# Patient Record
Sex: Female | Born: 1970 | Race: White | Hispanic: No | Marital: Married | Smoking: Current every day smoker
Health system: Southern US, Community
[De-identification: ages and names within clinical notes are randomized; demographics above are authoritative.]

## PROBLEM LIST (undated history)

## (undated) HISTORY — PX: APPENDECTOMY: SHX54

---

## 2013-09-15 ENCOUNTER — Other Ambulatory Visit: Payer: Self-pay | Admitting: Obstetrics and Gynecology

## 2013-10-01 NOTE — H&P (Signed)
Kathy Clarke, Kathy Clarke NO.:  [id number]  MEDICAL RECORD NO.:  [id number]  LOCATION:  [facility]                         FACILITY:  [facility]  PHYSICIAN:  Lovenia Kim, M.D.DATE OF BIRTH:  [birth year]  DATE OF ADMISSION:  10/02/2013 DATE OF DISCHARGE:                             HISTORY & PHYSICAL   CHIEF COMPLAINT:  Desire for sterilization.  HISTORY OF PRESENT ILLNESS:  A 43 year old female G3, P3, history of C- section x3 for tubal ligation.   ALLERGIES:  No known drug allergies.  MEDICATIONS:  No medications.  FAMILY HISTORY:  Heart disease, breast cancer.  SOCIAL HISTORY:  Noncontributory.  SURGICAL HISTORY:  C-section x3, appendectomy.  PHYSICAL EXAMINATION:  GENERAL:  A well-developed, well-nourished female, in no acute distress. HEENT:  Normal. NECK:  Supple.  Full range of motion. LUNGS:  Clear HEART:  Regular rate and rhythm. ABDOMEN:  Soft, nontender. PELVIC:  Normal size uterus and no adnexal masses. EXTREMITIES:  There were no cords. NEUROLOGIC:  Nonfocal. SKIN:  Intact.  IMPRESSION:  Desire for permanent sterilization.  PLAN:  Proceed with laparoscopic tubal ligation.  Risks of anesthesia, infection, bleeding, injury to surrounding organs, possible need for repair was discussed.  Delayed versus immediate complications include bowel and bladder injury noted.  The patient acknowledges and wishes to proceed.     Lovenia Kim, M.D.     RJT/MEDQ  D:  10/01/2013  T:  10/01/2013  Job:  [id number]

## 2013-10-02 ENCOUNTER — Encounter (HOSPITAL_COMMUNITY): Payer: BC Managed Care – PPO | Admitting: Anesthesiology

## 2013-10-02 ENCOUNTER — Encounter (HOSPITAL_COMMUNITY): Payer: Self-pay | Admitting: Pharmacist

## 2013-10-02 ENCOUNTER — Ambulatory Visit (HOSPITAL_COMMUNITY)
Admission: RE | Admit: 2013-10-02 | Discharge: 2013-10-02 | Disposition: A | Discharge status: Home / Self Care | Payer: BC Managed Care – PPO | Source: Ambulatory Visit | Attending: Obstetrics and Gynecology | Admitting: Obstetrics and Gynecology

## 2013-10-02 ENCOUNTER — Encounter (HOSPITAL_COMMUNITY)
Admission: RE | Disposition: A | Discharge status: Home / Self Care | Payer: Self-pay | Source: Ambulatory Visit | Attending: Obstetrics and Gynecology

## 2013-10-02 ENCOUNTER — Encounter (HOSPITAL_COMMUNITY): Payer: Self-pay | Admitting: *Deleted

## 2013-10-02 ENCOUNTER — Ambulatory Visit (HOSPITAL_COMMUNITY): Payer: BC Managed Care – PPO | Admitting: Anesthesiology

## 2013-10-02 DIAGNOSIS — F17200 Nicotine dependence, unspecified, uncomplicated: Secondary | ICD-10-CM | POA: Insufficient documentation

## 2013-10-02 DIAGNOSIS — Z302 Encounter for sterilization: Secondary | ICD-10-CM | POA: Insufficient documentation

## 2013-10-02 HISTORY — PX: LAPAROSCOPIC TUBAL LIGATION: SHX1937

## 2013-10-02 LAB — CBC
HCT: 39.9 % (ref 36.0–46.0)
Hemoglobin: 14.4 g/dL (ref 12.0–15.0)
MCH: 31.9 pg (ref 26.0–34.0)
MCHC: 36.1 g/dL — ABNORMAL HIGH (ref 30.0–36.0)
MCV: 88.5 fL (ref 78.0–100.0)
Platelets: 214 10*3/uL (ref 150–400)
RBC: 4.51 MIL/uL (ref 3.87–5.11)
RDW: 11.9 % (ref 11.5–15.5)
WBC: 8.1 10*3/uL (ref 4.0–10.5)

## 2013-10-02 LAB — HCG, SERUM, QUALITATIVE: Preg, Serum: NEGATIVE

## 2013-10-02 SURGERY — LIGATION, FALLOPIAN TUBE, LAPAROSCOPIC
Anesthesia: General | Site: Uterus | Laterality: Bilateral

## 2013-10-02 MED ORDER — FENTANYL CITRATE 0.05 MG/ML IJ SOLN
INTRAMUSCULAR | Status: DC | PRN
  Administered 2013-10-02 (×2): 100 ug via INTRAVENOUS
  Administered 2013-10-02 (×3): 50 ug via INTRAVENOUS

## 2013-10-02 MED ORDER — GLYCOPYRROLATE 0.2 MG/ML IJ SOLN
INTRAMUSCULAR | Status: AC
  Filled 2013-10-02: qty 3

## 2013-10-02 MED ORDER — BUPIVACAINE HCL (PF) 0.25 % IJ SOLN
INTRAMUSCULAR | Status: DC | PRN
  Administered 2013-10-02: 10 mL

## 2013-10-02 MED ORDER — ONDANSETRON HCL 4 MG/2ML IJ SOLN
INTRAMUSCULAR | Status: AC
  Filled 2013-10-02: qty 2

## 2013-10-02 MED ORDER — PROPOFOL 10 MG/ML IV EMUL
INTRAVENOUS | Status: AC
  Filled 2013-10-02: qty 40

## 2013-10-02 MED ORDER — LACTATED RINGERS IV SOLN
INTRAVENOUS | Status: DC
  Administered 2013-10-02 (×3): via INTRAVENOUS

## 2013-10-02 MED ORDER — NEOSTIGMINE METHYLSULFATE 10 MG/10ML IV SOLN
INTRAVENOUS | Status: AC
  Filled 2013-10-02: qty 1

## 2013-10-02 MED ORDER — MIDAZOLAM HCL 2 MG/2ML IJ SOLN
INTRAMUSCULAR | Status: DC | PRN
  Administered 2013-10-02: 2 mg via INTRAVENOUS

## 2013-10-02 MED ORDER — LIDOCAINE HCL (CARDIAC) 20 MG/ML IV SOLN
INTRAVENOUS | Status: DC | PRN
  Administered 2013-10-02: 100 mg via INTRAVENOUS

## 2013-10-02 MED ORDER — FENTANYL CITRATE 0.05 MG/ML IJ SOLN
INTRAMUSCULAR | Status: AC
  Filled 2013-10-02: qty 2

## 2013-10-02 MED ORDER — BUPIVACAINE HCL (PF) 0.25 % IJ SOLN
INTRAMUSCULAR | Status: AC
  Filled 2013-10-02: qty 30

## 2013-10-02 MED ORDER — PROMETHAZINE HCL 25 MG/ML IJ SOLN: 6.2500 mg | INTRAMUSCULAR | Status: DC | PRN

## 2013-10-02 MED ORDER — ROCURONIUM BROMIDE 100 MG/10ML IV SOLN
INTRAVENOUS | Status: AC
  Filled 2013-10-02: qty 1

## 2013-10-02 MED ORDER — FENTANYL CITRATE 0.05 MG/ML IJ SOLN
INTRAMUSCULAR | Status: AC
  Filled 2013-10-02: qty 5

## 2013-10-02 MED ORDER — PROPOFOL 10 MG/ML IV BOLUS
INTRAVENOUS | Status: DC | PRN
  Administered 2013-10-02: 50 mg via INTRAVENOUS
  Administered 2013-10-02: 100 mg via INTRAVENOUS
  Administered 2013-10-02: 150 mg via INTRAVENOUS

## 2013-10-02 MED ORDER — 0.9 % SODIUM CHLORIDE (POUR BTL) OPTIME
TOPICAL | Status: DC | PRN
  Administered 2013-10-02: 1000 mL

## 2013-10-02 MED ORDER — ROCURONIUM BROMIDE 100 MG/10ML IV SOLN
INTRAVENOUS | Status: DC | PRN
  Administered 2013-10-02: 40 mg via INTRAVENOUS
  Administered 2013-10-02: 10 mg via INTRAVENOUS

## 2013-10-02 MED ORDER — KETOROLAC TROMETHAMINE 30 MG/ML IJ SOLN: 15.0000 mg | Freq: Once | INTRAMUSCULAR | Status: DC | PRN

## 2013-10-02 MED ORDER — FENTANYL CITRATE 0.05 MG/ML IJ SOLN
25.0000 ug | INTRAMUSCULAR | Status: DC | PRN
  Administered 2013-10-02: 50 ug via INTRAVENOUS

## 2013-10-02 MED ORDER — LIDOCAINE HCL (CARDIAC) 20 MG/ML IV SOLN
INTRAVENOUS | Status: AC
  Filled 2013-10-02: qty 5

## 2013-10-02 MED ORDER — OXYCODONE-ACETAMINOPHEN 5-325 MG PO TABS: 1.0000 | ORAL_TABLET | ORAL | Status: DC | PRN

## 2013-10-02 MED ORDER — CEFAZOLIN SODIUM-DEXTROSE 2-3 GM-% IV SOLR
2.0000 g | INTRAVENOUS | Status: AC
  Administered 2013-10-02: 2 g via INTRAVENOUS

## 2013-10-02 MED ORDER — MIDAZOLAM HCL 2 MG/2ML IJ SOLN
INTRAMUSCULAR | Status: AC
  Filled 2013-10-02: qty 2

## 2013-10-02 MED ORDER — DEXAMETHASONE SODIUM PHOSPHATE 10 MG/ML IJ SOLN
INTRAMUSCULAR | Status: AC
  Filled 2013-10-02: qty 1

## 2013-10-02 MED ORDER — KETOROLAC TROMETHAMINE 30 MG/ML IJ SOLN
INTRAMUSCULAR | Status: DC | PRN
  Administered 2013-10-02: 30 mg via INTRAVENOUS

## 2013-10-02 MED ORDER — DEXAMETHASONE SODIUM PHOSPHATE 10 MG/ML IJ SOLN
INTRAMUSCULAR | Status: DC | PRN
  Administered 2013-10-02: 10 mg via INTRAVENOUS

## 2013-10-02 MED ORDER — MIDAZOLAM HCL 2 MG/2ML IJ SOLN: 0.5000 mg | Freq: Once | INTRAMUSCULAR | Status: DC | PRN

## 2013-10-02 MED ORDER — ONDANSETRON HCL 4 MG/2ML IJ SOLN
INTRAMUSCULAR | Status: DC | PRN
Start: 2013-10-02 — End: 2013-10-02
  Administered 2013-10-02: 4 mg via INTRAVENOUS

## 2013-10-02 MED ORDER — MEPERIDINE HCL 25 MG/ML IJ SOLN: 6.2500 mg | INTRAMUSCULAR | Status: DC | PRN

## 2013-10-02 MED ORDER — GLYCOPYRROLATE 0.2 MG/ML IJ SOLN
INTRAMUSCULAR | Status: DC | PRN
  Administered 2013-10-02: 0.6 mg via INTRAVENOUS

## 2013-10-02 MED ORDER — KETOROLAC TROMETHAMINE 30 MG/ML IJ SOLN
INTRAMUSCULAR | Status: AC
  Filled 2013-10-02: qty 1

## 2013-10-02 MED ORDER — NEOSTIGMINE METHYLSULFATE 10 MG/10ML IV SOLN
INTRAVENOUS | Status: DC | PRN
Start: 2013-10-02 — End: 2013-10-02
  Administered 2013-10-02: 4 mg via INTRAVENOUS

## 2013-10-02 SURGICAL SUPPLY — 17 items
CATH ROBINSON RED A/P 16FR (CATHETERS) ×3 IMPLANT
CLOTH BEACON ORANGE TIMEOUT ST (SAFETY) ×3 IMPLANT
DERMABOND ADHESIVE PROPEN (GAUZE/BANDAGES/DRESSINGS) ×2
DERMABOND ADVANCED (GAUZE/BANDAGES/DRESSINGS) ×2
DERMABOND ADVANCED .7 DNX12 (GAUZE/BANDAGES/DRESSINGS) ×1 IMPLANT
DERMABOND ADVANCED .7 DNX6 (GAUZE/BANDAGES/DRESSINGS) ×1 IMPLANT
GLOVE BIO SURGEON STRL SZ7.5 (GLOVE) ×3 IMPLANT
GOWN STRL REUS W/TWL LRG LVL3 (GOWN DISPOSABLE) ×6 IMPLANT
NEEDLE INSUFFLATION 150MM (ENDOMECHANICALS) ×3 IMPLANT
PACK LAPAROSCOPY BASIN (CUSTOM PROCEDURE TRAY) ×3 IMPLANT
SOLUTION ELECTROLUBE (MISCELLANEOUS) ×3 IMPLANT
SUT VICRYL 0 UR6 27IN ABS (SUTURE) ×3 IMPLANT
SUT VICRYL 4-0 PS2 18IN ABS (SUTURE) ×3 IMPLANT
TOWEL OR 17X24 6PK STRL BLUE (TOWEL DISPOSABLE) ×6 IMPLANT
TROCAR XCEL DIL TIP R 11M (ENDOMECHANICALS) ×3 IMPLANT
WARMER LAPAROSCOPE (MISCELLANEOUS) ×3 IMPLANT
WATER STERILE IRR 1000ML POUR (IV SOLUTION) ×3 IMPLANT

## 2013-10-02 NOTE — Progress Notes (Signed)
Patient ID: Kathy Clarke, female   DOB: 1970-12-06, 44 y.o.   MRN: [id number] Patient seen and examined. Consent witnessed and signed. No changes noted. Update completed.

## 2013-10-02 NOTE — Transfer of Care (Signed)
Immediate Anesthesia Transfer of Care Note  Patient: Kathy Clarke  Procedure(s) Performed: Procedure(s): LAPAROSCOPIC TUBAL LIGATION (Bilateral)  Patient Location: PACU  Anesthesia Type:General  Level of Consciousness: awake, alert  and oriented  Airway & Oxygen Therapy: Patient Spontanous Breathing and Patient connected to nasal cannula oxygen  Post-op Assessment: Report given to PACU RN and Post -op Vital signs reviewed and stable  Post vital signs: Reviewed and stable  Complications: No apparent anesthesia complications

## 2013-10-02 NOTE — Anesthesia Procedure Notes (Signed)
Procedure Name: Intubation Date/Time: 10/02/2013 2:03 PM Performed by: Tico Crotteau, Sheron Nightingale Pre-anesthesia Checklist: Patient identified, Timeout performed, Emergency Drugs available, Suction available and Patient being monitored Patient Re-evaluated:Patient Re-evaluated prior to inductionOxygen Delivery Method: Circle system utilized Preoxygenation: Pre-oxygenation with 100% oxygen Intubation Type: IV induction Ventilation: Mask ventilation without difficulty Laryngoscope Size: Mac and 3 Grade View: Grade I Tube type: Oral Laser Tube: Cuffed inflated with minimal occlusive pressure - saline Tube size: 7.0 mm Number of attempts: 1 Placement Confirmation: ETT inserted through vocal cords under direct vision,  breath sounds checked- equal and bilateral and positive ETCO2 Secured at: 21 cm Dental Injury: Teeth and Oropharynx as per pre-operative assessment

## 2013-10-02 NOTE — Discharge Instructions (Signed)
DISCHARGE INSTRUCTIONS: Laparoscopy The following instructions have been prepared to help you care for yourself upon your return home today.  No Ibuprofen containing products (ie. Advil, Aleve, Motrin, etc.) until after 8:30 pm tonight.  Wound care:  Do not get the incision wet for the first 24 hours. The incision should be kept clean and dry.  The Band-Aids or dressings may be removed the day after surgery.  Should the incision become sore, red, and swollen after the first week, check with your doctor.  Personal hygiene:  Shower the day after your procedure.  Activity and limitations:  Do NOT drive or operate any equipment today.  Do NOT lift anything more than 15 pounds for 2-3 weeks after surgery.  Do NOT rest in bed all day.  Walking is encouraged. Walk each day, starting slowly with 5-minute walks 3 or 4 times a day. Slowly increase the length of your walks.  Walk up and down stairs slowly.  Do NOT do strenuous activities, such as golfing, playing tennis, bowling, running, biking, weight lifting, gardening, mowing, or vacuuming for 2-4 weeks. Ask your doctor when it is okay to start.  Diet: Eat a light meal as desired this evening. You may resume your usual diet tomorrow.  Return to work: This is dependent on the type of work you do. For the most part you can return to a desk job within a week of surgery. If you are more active at work, please discuss this with your doctor.  What to expect after your surgery: You may have a slight burning sensation when you urinate on the first day. You may have a very small amount of blood in the urine. Expect to have a small amount of vaginal discharge/light bleeding for 1-2 weeks. It is not unusual to have abdominal soreness and bruising for up to 2 weeks. You may be tired and need more rest for about 1 week. You may experience shoulder pain for 24-72 hours. Lying flat in bed may relieve it.  Call your doctor for any of the following:   Develop a fever of 100.4 or greater  Inability to urinate 6 hours after discharge from hospital  Severe pain not relieved by pain medications  Persistent of heavy bleeding at incision site  Redness or swelling around incision site after a week  Increasing nausea or vomiting  Patient Signature________________________________________ Nurse Signature_________________________________________

## 2013-10-02 NOTE — Anesthesia Postprocedure Evaluation (Signed)
  Anesthesia Post Note  Patient: Kathy Clarke  Procedure(s) Performed: Procedure(s) (LRB): LAPAROSCOPIC TUBAL LIGATION (Bilateral)  Anesthesia type: GA  Patient location: PACU  Post pain: Pain level controlled  Post assessment: Post-op Vital signs reviewed  Last Vitals:  Filed Vitals:   10/02/13 1239  BP: 117/78  Pulse: 105  Temp: 37.3 C  Resp: 20    Post vital signs: Reviewed  Level of consciousness: sedated  Complications: No apparent anesthesia complications

## 2013-10-02 NOTE — Anesthesia Preprocedure Evaluation (Signed)
Anesthesia Evaluation  Patient identified by MRN, date of birth, ID band Patient awake    Reviewed: Allergy & Precautions, H&P , Patient's Chart, lab work & pertinent test results, reviewed documented beta blocker date and time   History of Anesthesia Complications Negative for: history of anesthetic complications  Airway Mallampati: III TM Distance: >3 FB Neck ROM: full    Dental   Pulmonary Current Smoker,  breath sounds clear to auscultation        Cardiovascular Exercise Tolerance: Good Rhythm:regular Rate:Normal     Neuro/Psych    GI/Hepatic   Endo/Other    Renal/GU      Musculoskeletal   Abdominal   Peds  Hematology   Anesthesia Other Findings   Reproductive/Obstetrics                           Anesthesia Physical Anesthesia Plan  ASA: II  Anesthesia Plan: General ETT   Post-op Pain Management:    Induction:   Airway Management Planned:   Additional Equipment:   Intra-op Plan:   Post-operative Plan:   Informed Consent: I have reviewed the patients History and Physical, chart, labs and discussed the procedure including the risks, benefits and alternatives for the proposed anesthesia with the patient or authorized representative who has indicated his/her understanding and acceptance.   Dental Advisory Given  Plan Discussed with: CRNA and Surgeon  Anesthesia Plan Comments:         Anesthesia Quick Evaluation

## 2013-10-02 NOTE — Op Note (Signed)
10/02/2013  2:40 PM  PATIENT:  Kathy Clarke  43 y.o. female  PRE-OPERATIVE DIAGNOSIS:  Desires Sterilization  POST-OPERATIVE DIAGNOSIS:  Desires Sterilization Omental adhesions to anterior abdominal wall  PROCEDURE:  Procedure(s): LAPAROSCOPIC TUBAL LIGATION LYSIS OF ADHESIONS  SURGEON:  Surgeon(s): Lovenia Kim, MD  ASSISTANTS: none   ANESTHESIA:   local and general  ESTIMATED BLOOD LOSS: minimal  DRAINS: none   LOCAL MEDICATIONS USED:  MARCAINE     SPECIMEN:  No Specimen  DISPOSITION OF SPECIMEN:  N/A  COUNTS:  YES  DICTATION #: [id number]  PLAN OF CARE: dc home  PATIENT DISPOSITION:  PACU - hemodynamically stable.

## 2013-10-03 NOTE — Op Note (Signed)
NAMETAKIMA, ENCINA NO.:  [id number]  MEDICAL RECORD NO.:  [id number]  LOCATION:  [facility]                          FACILITY:  [facility]  PHYSICIAN:  Lovenia Kim, M.D.DATE OF BIRTH:  [birth year]  DATE OF PROCEDURE: DATE OF DISCHARGE:  10/02/2013                              OPERATIVE REPORT   PREOPERATIVE DIAGNOSIS:  Desire for elective sterilization.  POSTOPERATIVE DIAGNOSES:  Desire for elective sterilization.  Omental adhesions to anterior abdominal wall.  PROCEDURE:  Laparoscopic tubal ligation and laparoscopic lysis of adhesions.  SURGEON:  Lovenia Kim, M.D.  ASSISTANT:  None.  ANESTHESIA:  Local and general.  ESTIMATED BLOOD LOSS:  Less than 50 mL.  COMPLICATIONS:  None.  DRAINS:  None.  COUNTS:  Correct.  DISPOSITION:  The patient to recovery in good condition.  BRIEF OPERATIVE NOTE:  After being apprised of the risks of anesthesia, infection, bleeding, injury to surrounding organs, possible need for repair, delayed versus immediate complications to include bowel and bladder injury with possible need for repair, the patient was brought to the operating room where she was administered a general anesthetic without complications.  Prepped and draped in usual sterile fashion. Catheterized until the bladder was empty.  A Hulka tenaculum was placed per vagina.  Exam under anesthesia revealed a bulky anteflexed uterus with no adnexal masses.  Attention was turned to the abdominal portion of the procedure whereby an infraumbilical incision was made with a scalpel.  Veress needle was placed, opening pressure of 2 noted, 3 liters of CO2 insufflated without difficulty.  Atraumatic trocar entry, and upon entry, it was noted that there were some ribbon of anterior abdominal wall adhesions from the omentum.  No bowel involved.  There was evidence of atraumatic trocar entry.  Pictures were taken.  At this time, omental adhesions were lysed so that  access can be made to the pelvis using sharp dissection.  At this time, the uterus was identified as a normal structure, normal anterior posterior cul-de-sac, normal right and left tube, normal right and left ovary.  The right tube was traced out to the fimbriated end and ampullary isthmic portion of the tube was identified and cauterized using bipolar cautery on three contiguous segments to resistance of 0.  At this time, the tube was then divided, tubal lumens were visualized and the same procedure was done on the left.  Tubal lumens were visualized on both sides and the tubes were well divided.  At this time, CO2 was released.  No evidence of bleeding. Adhesiolysis appeared to be hemostatic.  All CO2 was released.  Positive pressure was applied.  Trocar was removed atraumatically under direct visualization.  At this time, please note, prior to trocar removal, anterior upper abdomen exploration revealed no evidence of adhesions, no evidence of bowel injury and normal diaphragm, liver, gallbladder area. At this time having removed the trocar and CO2 having been released, the incision was closed using 0 Vicryl, 4-0 Vicryl, and Dermabond was placed.  All instruments were removed from the vagina.  Marcaine solution was placed in the umbilical incision.  The patient tolerated the procedure well, was awakened and transferred to the recovery in good condition.  Lovenia Kim, M.D.     RJT/MEDQ  D:  10/02/2013  T:  10/03/2013  Job:  [id number]

## 2013-10-05 ENCOUNTER — Encounter (HOSPITAL_COMMUNITY): Payer: Self-pay | Admitting: Obstetrics and Gynecology

## 2016-12-31 ENCOUNTER — Ambulatory Visit (HOSPITAL_COMMUNITY)
Admission: EM | Admit: 2016-12-31 | Discharge: 2016-12-31 | Disposition: A | Discharge status: Home / Self Care | Payer: 59 | Attending: Emergency Medicine | Admitting: Emergency Medicine

## 2016-12-31 ENCOUNTER — Encounter (HOSPITAL_COMMUNITY): Payer: Self-pay | Admitting: *Deleted

## 2016-12-31 DIAGNOSIS — B029 Zoster without complications: Secondary | ICD-10-CM

## 2016-12-31 MED ORDER — VALACYCLOVIR HCL 1 G PO TABS: 1000.0000 mg | ORAL_TABLET | Freq: Three times a day (TID) | ORAL | 0 refills | Status: AC

## 2016-12-31 MED ORDER — OXYCODONE-ACETAMINOPHEN 5-325 MG PO TABS: 1.0000 | ORAL_TABLET | ORAL | 0 refills | Status: DC | PRN

## 2016-12-31 NOTE — ED Provider Notes (Signed)
  [facility]   [id number] 12/31/16 Arrival Time: 1658   SUBJECTIVE:  Kathy Clarke is a 46 y.o. female who presents to the urgent care  with complaint of burning, tingling, and a rash along her right flank, radiating along her right side to her abdomen. Also has had malaise, loss of appetite, but no fever or chills.  ROS: As per HPI, remainder of ROS negative.   OBJECTIVE:  Vitals:   12/31/16 1729  BP: 131/76  Pulse: 69  Resp: 16  Temp: 98.9 F (37.2 C)  TempSrc: Oral  SpO2: 100%     General appearance: alert; no distress HEENT: normocephalic; atraumatic; conjunctivae normal;  Neck: Trachea midline, no JVD noted Lungs: clear to auscultation bilaterally Heart: regular rate and rhythm Abdomen: soft, non-tender;  Musculoskeletal: Grossly symmetrical Skin: warm and dry, erythemic, vesicular lesions lower right side of the back extending in a dermatomal pattern forward towards the midline Neurologic: Grossly normal Psychological:  alert and cooperative; normal mood and affect     ASSESSMENT & PLAN:  1. Herpes zoster without complication     Meds ordered this encounter  Medications  . valACYclovir (VALTREX) 1000 MG tablet    Sig: Take 1 tablet (1,000 mg total) by mouth 3 (three) times daily.    Dispense:  21 tablet    Refill:  0    Order Specific Question:   Supervising Provider    Answer:   Vanessa Kick [id number]  . oxyCODONE-acetaminophen (PERCOCET/ROXICET) 5-325 MG tablet    Sig: Take 1 tablet by mouth every 4 (four) hours as needed for severe pain.    Dispense:  20 tablet    Refill:  0    Order Specific Question:   Supervising Provider    Answer:   Vanessa Kick [[id number]]    Reviewed expectations re: course of current medical issues. Questions answered. Outlined signs and symptoms indicating need for more acute intervention. Patient verbalized understanding. After Visit Summary given.    Procedures:      Labs Reviewed - No data to  display  No results found.  No Known Allergies  PMHx, SurgHx, SocialHx, Medications, and Allergies were reviewed in the Visit Navigator and updated as appropriate.       Barnet Glasgow, NP 12/31/16 1900

## 2016-12-31 NOTE — Discharge Instructions (Signed)
You have shingles. I have prescribed Valtrex, one tablet 3 times a day for a week. I have also prescribed a medicine for pain called oxycodone, this medicine is a narcotic, it will cause drowsiness, and it is addictive. Do not take more than what is necessary, do not drink alcohol while taking, and do not operate any heavy machinery while taking this medicine. If pain persists or fails to resolve, return to clinic as needed.

## 2016-12-31 NOTE — ED Triage Notes (Signed)
Patient reports not feeling well since Wednesday, light headed and fatigued. Patient now has unilateral rash on right flank. described as burning, tingling, itching. Described as a constant pain.

## 2018-08-20 ENCOUNTER — Emergency Department (HOSPITAL_COMMUNITY): Payer: Medicaid Other

## 2018-08-20 ENCOUNTER — Emergency Department (HOSPITAL_COMMUNITY)
Admission: EM | Admit: 2018-08-20 | Discharge: 2018-08-21 | Disposition: A | Discharge status: Home / Self Care | Payer: Medicaid Other | Attending: Emergency Medicine | Admitting: Emergency Medicine

## 2018-08-20 ENCOUNTER — Encounter (HOSPITAL_COMMUNITY): Payer: Self-pay

## 2018-08-20 DIAGNOSIS — R0789 Other chest pain: Secondary | ICD-10-CM | POA: Insufficient documentation

## 2018-08-20 DIAGNOSIS — F17210 Nicotine dependence, cigarettes, uncomplicated: Secondary | ICD-10-CM | POA: Diagnosis not present

## 2018-08-20 DIAGNOSIS — R079 Chest pain, unspecified: Secondary | ICD-10-CM | POA: Diagnosis present

## 2018-08-20 LAB — BASIC METABOLIC PANEL
Anion gap: 11 (ref 5–15)
BUN: 9 mg/dL (ref 6–20)
CO2: 22 mmol/L (ref 22–32)
Calcium: 9.2 mg/dL (ref 8.9–10.3)
Chloride: 107 mmol/L (ref 98–111)
Creatinine, Ser: 0.68 mg/dL (ref 0.44–1.00)
GFR calc Af Amer: >60 mL/min (ref >60)
GFR calc non Af Amer: >60 mL/min (ref >60)
Glucose, Bld: 79 mg/dL (ref 70–99)
Potassium: 3.1 mmol/L — ABNORMAL LOW (ref 3.5–5.1)
Sodium: 140 mmol/L (ref 135–145)

## 2018-08-20 LAB — TROPONIN I: Troponin I: <0.03 ng/mL (ref <0.03)

## 2018-08-20 LAB — CBC
HCT: 39.3 % (ref 36.0–46.0)
Hemoglobin: 13.3 g/dL (ref 12.0–15.0)
MCH: 31.8 pg (ref 26.0–34.0)
MCHC: 33.8 g/dL (ref 30.0–36.0)
MCV: 94 fL (ref 80.0–100.0)
Platelets: 246 10*3/uL (ref 150–400)
RBC: 4.18 MIL/uL (ref 3.87–5.11)
RDW: 11.6 % (ref 11.5–15.5)
WBC: 6.3 10*3/uL (ref 4.0–10.5)
nRBC: 0 % (ref 0.0–0.2)

## 2018-08-20 LAB — I-STAT BETA HCG BLOOD, ED (MC, WL, AP ONLY): I-stat hCG, quantitative: <5 m[IU]/mL (ref <5)

## 2018-08-20 MED ORDER — SODIUM CHLORIDE 0.9% FLUSH: 3.0000 mL | Freq: Once | INTRAVENOUS | Status: DC

## 2018-08-20 NOTE — ED Triage Notes (Signed)
Patient reports intermittent chest pain x 4 days. States she went to UC 3 weeks ago and was FLU negative but was told she could have COVID-19, to go home and quarantine. Patient also c/o LEFT flank pain and SOB. Denies abdominal pain, dysuria, fever, or cough.

## 2018-08-21 ENCOUNTER — Telehealth: Payer: Self-pay | Admitting: Cardiology

## 2018-08-21 LAB — HEPATIC FUNCTION PANEL
ALT: 16 U/L (ref 0–44)
AST: 18 U/L (ref 15–41)
Albumin: 3.6 g/dL (ref 3.5–5.0)
Alkaline Phosphatase: 35 U/L — ABNORMAL LOW (ref 38–126)
Bilirubin, Direct: <0.1 mg/dL (ref 0.0–0.2)
Total Bilirubin: 0.5 mg/dL (ref 0.3–1.2)
Total Protein: 6.6 g/dL (ref 6.5–8.1)

## 2018-08-21 LAB — TROPONIN I: Troponin I: <0.03 ng/mL (ref <0.03)

## 2018-08-21 LAB — LIPASE, BLOOD: Lipase: 26 U/L (ref 11–51)

## 2018-08-21 LAB — D-DIMER, QUANTITATIVE (NOT AT ARMC): D-Dimer, Quant: 0.33 ug/mL-FEU (ref 0.00–0.50)

## 2018-08-21 MED ORDER — ALUM & MAG HYDROXIDE-SIMETH 200-200-20 MG/5ML PO SUSP
30.0000 mL | Freq: Once | ORAL | Status: AC
  Administered 2018-08-21: 01:00:00 30 mL via ORAL
  Filled 2018-08-21: qty 30

## 2018-08-21 MED ORDER — NAPROXEN 500 MG PO TABS: 500.0000 mg | ORAL_TABLET | Freq: Two times a day (BID) | ORAL | 0 refills | Status: DC | PRN

## 2018-08-21 MED ORDER — POTASSIUM CHLORIDE CRYS ER 20 MEQ PO TBCR: 40.0000 meq | EXTENDED_RELEASE_TABLET | Freq: Once | ORAL | Status: DC

## 2018-08-21 NOTE — Telephone Encounter (Signed)
Discussed with Dr Percival Spanish and pt needs appt. Pt notified and appt made with Dr Gwenlyn Found for tom at 9:00 am

## 2018-08-21 NOTE — Telephone Encounter (Signed)
Follow Up:    Pt returning your call. She will take any time you have available for appointment for tomorrow.

## 2018-08-21 NOTE — ED Notes (Signed)
Med given up to the br

## 2018-08-21 NOTE — ED Notes (Signed)
The pt is c/o anterior and posterior chest pain for one week worse tonight  Heaviness in her chest now

## 2018-08-21 NOTE — Telephone Encounter (Signed)
Lm to call back ./cy 

## 2018-08-21 NOTE — Telephone Encounter (Signed)
New Message:   Pt went to [facility] ER yesterday for Sharp chest pain, tightness in chest and Short of breath.See notes in Epic-Pt have had Corona Virus about 3 weeks ago.- Pt was told to follow up with a Cardiologist today or tomorrow.Pt needs an appt.

## 2018-08-21 NOTE — Discharge Instructions (Addendum)
There is no evidence of heart attack or blood clot in the lung.  You should follow-up with the cardiologist to have an echocardiogram of your heart.  Return to the ED if your chest pain becomes exertional, associated shortness of breath, sweating, vomiting or other concerns.

## 2018-08-21 NOTE — ED Provider Notes (Signed)
[facility] EMERGENCY DEPARTMENT Provider Note   CSN: [id number] Arrival date & time: 08/20/18  2038    History   Chief Complaint Chief Complaint  Patient presents with  . Chest Pain    HPI Kathy Clarke is a 48 y.o. female.     Patient with no previous past medical history.  States she is been having intermittent sharp chest pain for the past 3 or 4 days.  It is in the center of her chest lasting for just a few seconds at a time.  Since about 2 PM today she is had constant pressure and burning in the center of her chest associated with some shortness of breath.  Is mildly worse when she tries to walk around.  She denies any cough or fever.  She reports she was seen in urgent care on March 23 for influenza-like illness and was told she could have had coronavirus but states she has been feeling better for the past several days after quarantine at home.  No further cough, runny nose, fever or congestion.  She denies any abdominal pain, fever or cough.  She denies any cardiac history.  She does not take any regular medications.  No leg pain or leg swelling.  Denies any history of ulcers or acid reflux.  Occasionally she gets sharp stabbing pain in her mid back that lasts for a few seconds at a time but is not having this currently. She became concerned tonight with a pressure in her chest that is been fairly constant and associated with shortness of breath which is new to her.  The history is provided by the patient.  Chest Pain  Associated symptoms: shortness of breath   Associated symptoms: no abdominal pain, no dizziness, no fever, no headache, no nausea, no vomiting and no weakness     History reviewed. No pertinent past medical history.  There are no active problems to display for this patient.   Past Surgical History:  Procedure Laterality Date  . APPENDECTOMY    . CESAREAN SECTION     x3  . LAPAROSCOPIC TUBAL LIGATION Bilateral 10/02/2013   Procedure:  LAPAROSCOPIC TUBAL LIGATION;  Surgeon: Lovenia Kim, MD;  Location: [facility];  Service: Gynecology;  Laterality: Bilateral;     OB History   No obstetric history on file.      Home Medications    Prior to Admission medications   Medication Sig Start Date End Date Taking? Authorizing Provider  oxyCODONE-acetaminophen (PERCOCET/ROXICET) 5-325 MG tablet Take 1 tablet by mouth every 4 (four) hours as needed for severe pain. 12/31/16   Barnet Glasgow, NP    Family History No family history on file.  Social History Social History   Tobacco Use  . Smoking status: Current Every Day Smoker    Packs/day: 0.25    Types: Cigarettes  . Smokeless tobacco: Never Used  Substance Use Topics  . Alcohol use: No  . Drug use: No     Allergies   Patient has no known allergies.   Review of Systems Review of Systems  Constitutional: Negative for activity change, appetite change and fever.  HENT: Negative for congestion and rhinorrhea.   Respiratory: Positive for chest tightness and shortness of breath.   Cardiovascular: Positive for chest pain.  Gastrointestinal: Negative for abdominal pain, nausea and vomiting.  Genitourinary: Negative for dysuria and hematuria.  Musculoskeletal: Negative for arthralgias and myalgias.  Skin: Negative for rash.  Neurological: Negative for dizziness, weakness and headaches.  all other systems are negative except as noted in the HPI and PMH.     Physical Exam Updated Vital Signs BP 124/69 (BP Location: Left Arm)   Pulse 91   Temp 98 F (36.7 C) (Oral)   Resp 18   LMP 08/04/2018   SpO2 99%   Physical Exam Vitals signs and nursing note reviewed.  Constitutional:      General: She is not in acute distress.    Appearance: She is well-developed.  HENT:     Head: Normocephalic and atraumatic.     Mouth/Throat:     Pharynx: No oropharyngeal exudate.  Eyes:     Conjunctiva/sclera: Conjunctivae normal.     Pupils: Pupils are equal, round,  and reactive to light.  Neck:     Musculoskeletal: Normal range of motion and neck supple.     Comments: No meningismus. Cardiovascular:     Rate and Rhythm: Normal rate and regular rhythm.     Heart sounds: Normal heart sounds. No murmur.  Pulmonary:     Effort: Pulmonary effort is normal. No respiratory distress.     Breath sounds: Normal breath sounds.  Chest:     Chest wall: Tenderness present.  Abdominal:     Palpations: Abdomen is soft.     Tenderness: There is no abdominal tenderness. There is no guarding or rebound.  Musculoskeletal: Normal range of motion.        General: No tenderness.  Skin:    General: Skin is warm.     Capillary Refill: Capillary refill takes less than 2 seconds.  Neurological:     General: No focal deficit present.     Mental Status: She is alert and oriented to person, place, and time. Mental status is at baseline.     Cranial Nerves: No cranial nerve deficit.     Motor: No abnormal muscle tone.     Coordination: Coordination normal.     Comments: No ataxia on finger to nose bilaterally. No pronator drift. 5/5 strength throughout. CN 2-12 intact.Equal grip strength. Sensation intact.   Psychiatric:        Behavior: Behavior normal.      ED Treatments / Results  Labs (all labs ordered are listed, but only abnormal results are displayed) Labs Reviewed  BASIC METABOLIC PANEL - Abnormal; Notable for the following components:      Result Value   Potassium 3.1 (*)    All other components within normal limits  HEPATIC FUNCTION PANEL - Abnormal; Notable for the following components:   Alkaline Phosphatase 35 (*)    All other components within normal limits  CBC  TROPONIN I  TROPONIN I  D-DIMER, QUANTITATIVE (NOT AT [facility])  LIPASE, BLOOD  I-STAT BETA HCG BLOOD, ED ([facility], [facility], [facility] ONLY)    EKG EKG Interpretation  Date/Time:  Wednesday August 20 2018 20:52:05 EDT Ventricular Rate:  97 PR Interval:  138 QRS Duration: 84 QT Interval:  344 QTC  Calculation: 436 R Axis:   62 Text Interpretation:  Normal sinus rhythm Nonspecific ST abnormality Abnormal ECG No previous ECGs available Confirmed by Ezequiel Essex [id number]) on 08/21/2018 12:14:31 AM   Radiology Dg Chest 2 View  Result Date: 08/20/2018 CLINICAL DATA:  Chest pain EXAM: CHEST - 2 VIEW COMPARISON:  None. FINDINGS: The heart size and mediastinal contours are within normal limits. Both lungs are clear. The visualized skeletal structures are unremarkable. IMPRESSION: No active cardiopulmonary disease. Electronically Signed   By: Kathreen Devoid   On:  08/20/2018 21:28    Procedures Ultrasound ED Echo Date/Time: 08/21/2018 3:01 AM Performed by: Ezequiel Essex, MD Authorized by: Ezequiel Essex, MD   Procedure details:    Indications: dyspnea     Views: subxiphoid and apical 4 chamber view     Images: archived     Limitations:  Body habitus and patient compliance Findings:    Pericardium: no pericardial effusion     LV Function: normal (>50% EF)     RV Diameter: normal     IVC: normal   Impression:    Impression: normal     (including critical care time)  Medications Ordered in ED Medications  sodium chloride flush (NS) 0.9 % injection 3 mL (has no administration in time range)  alum & mag hydroxide-simeth (MAALOX/MYLANTA) 200-200-20 MG/5ML suspension 30 mL (has no administration in time range)     Initial Impression / Assessment and Plan / ED Course  I have reviewed the triage vital signs and the nursing notes.  Pertinent labs & imaging results that were available during my care of the patient were reviewed by me and considered in my medical decision making (see chart for details).       Chest pressure since 2pm.  Associated from shortness of breath.  Intermittent sharp stabbing pain in the center of her chest for the past several days.  Her EKG shows diffuse ST depressions without comparison.  Troponin is negative.  Chest x-ray is negative.  D/w Dr.  Paticia Stack of cardiology.  He states EKG changes are nonspecific.  Question hypertensive cardiomyopathy with high voltage but patient has no history of hypertension.  On repeat EKG ST changes have normalized.  Low suspicion for ACS.  With recent viral syndrome, consider pericarditis or myocarditis but would not expect EKG changes to resolve.  Also expect enzymes to be positive. Bedside ultrasound has not shown a pericardial effusion.  Troponin negative x2.  Patient's chest pain has not improved with GI cocktail.  It is somewhat reproducible.  Her bedside ultrasound does not show any pericardial effusion.  Low suspicion for pericarditis or myocarditis.  She has no fever and her troponins are negative.  ACS seems unlikely with ongoing pain since 2 PM.  Plan discharge home with follow-up for outpatient ultrasound.  Patient be treated with anti-inflammatories are suspected musculoskeletal chest pain.  There is no evidence of ACS and her EKG changes have normalized.  Low suspicion for pericarditis or myocarditis.  Low suspicion for PE or aortic dissection.  Advised to follow-up with cardiology for echocardiogram.  Return to the ED with chest pain that becomes exertional, associated with shortness of breath, vomiting, sweating or other concerns. Final Clinical Impressions(s) / ED Diagnoses   Final diagnoses:  Atypical chest pain    ED Discharge Orders    None       Oanh Devivo, Annie Main, MD 08/21/18 [phone]

## 2018-08-22 ENCOUNTER — Ambulatory Visit: Payer: Medicaid Other | Admitting: Cardiovascular Disease

## 2018-08-22 ENCOUNTER — Encounter: Payer: Self-pay | Admitting: Cardiovascular Disease

## 2018-08-22 ENCOUNTER — Telehealth: Payer: Self-pay

## 2018-08-22 ENCOUNTER — Other Ambulatory Visit: Payer: Self-pay

## 2018-08-22 ENCOUNTER — Telehealth (INDEPENDENT_AMBULATORY_CARE_PROVIDER_SITE_OTHER): Payer: Medicaid Other | Admitting: Cardiovascular Disease

## 2018-08-22 VITALS — BP 109/68 | HR 63 | Ht 67.0 in

## 2018-08-22 DIAGNOSIS — R0789 Other chest pain: Secondary | ICD-10-CM | POA: Diagnosis not present

## 2018-08-22 DIAGNOSIS — R079 Chest pain, unspecified: Secondary | ICD-10-CM | POA: Diagnosis not present

## 2018-08-22 DIAGNOSIS — Z01812 Encounter for preprocedural laboratory examination: Secondary | ICD-10-CM

## 2018-08-22 MED ORDER — ASPIRIN 81 MG PO TABS: 81.0000 mg | ORAL_TABLET | Freq: Every day | ORAL | 1 refills | Status: DC

## 2018-08-22 MED ORDER — METOPROLOL TARTRATE 50 MG PO TABS: 50.0000 mg | ORAL_TABLET | Freq: Once | ORAL | 0 refills | Status: DC

## 2018-08-22 NOTE — Telephone Encounter (Signed)
Patient and/or DPR-approved person aware of AVS instructions and verbalized understanding. AVS EMAILED VIA SECURE EMAIL TO THE PT EMAIL ADDRESS ON FILE

## 2018-08-22 NOTE — Patient Instructions (Addendum)
CORONARY CTA TO BE SCHEDULED FOR SOMETIME WITHIN 08/25/2018-08/29/2018  Please arrive at the [facility] main entrance of [facility] at xx:xx AM (30-45 minutes prior to test start time)  [facility] [place], [place] [address] [phone]  Proceed to the [facility] Radiology Department (First Floor).  Please follow these instructions carefully (unless otherwise directed):   On the Night Before the Test: . Be sure to Drink plenty of water. . Do not consume any caffeinated/decaffeinated beverages or chocolate 12 hours prior to your test. . Do not take any antihistamines 12 hours prior to your test.  On the Day of the Test: . Drink plenty of water. Do not drink any water within one hour of the test. . Do not eat any food 4 hours prior to the test. . You may take your regular medications prior to the test.  . Take metoprolol (Lopressor) 50 MG, two hours prior to test.        After the Test: . Drink plenty of water. . After receiving IV contrast, you may experience a mild flushed feeling. This is normal. . On occasion, you may experience a mild rash up to 24 hours after the test. This is not dangerous. If this occurs, you can take Benadryl 25 mg and increase your fluid intake. . If you experience trouble breathing, this can be serious. If it is severe call 911 IMMEDIATELY. If it is mild, please call our office. . If you take any of these medications: Glipizide/Metformin, Avandament, Glucavance, please do not take 48 hours after completing test.    Testing/Procedures: Your physician has requested that you have an echocardiogram. Echocardiography is a painless test that uses sound waves to create images of your heart. It provides your doctor with information about the size and shape of your heart and how well your heart's chambers and valves are working. This procedure takes approximately one hour. There are no restrictions for this procedure. PLEASE  SCHEDULE THIS FOR SOMETIME WITHIN 08/25/2018-08/29/2018  Follow-Up: At [facility], you and your health needs are our priority.  As part of our continuing mission to provide you with exceptional heart care, we have created designated Provider Care Teams.  These Care Teams include your primary Cardiologist (physician) and Advanced Practice Providers (APPs -  Physician Assistants and Nurse Practitioners) who all work together to provide you with the care you need, when you need it. You will need a follow up appointment with Dr. Gwenlyn Found in 3 months, if your results are normal. If your results are abnormal, you will be contacted to schedule another virtual visit soon.

## 2018-08-22 NOTE — Progress Notes (Signed)
Virtual Visit via Video Note   This visit type was conducted due to national recommendations for restrictions regarding the COVID-19 Pandemic (e.g. social distancing) in an effort to limit this patient's exposure and mitigate transmission in our community.  Due to her co-morbid illnesses, this patient is at least at moderate risk for complications without adequate follow up.  This format is felt to be most appropriate for this patient at this time.  All issues noted in this document were discussed and addressed.  A limited physical exam was performed with this format.  Please refer to the patient's chart for her consent to telehealth for [facility].   Evaluation Performed:  Follow-up visit  Date:  08/22/2018   ID:  Kathy Clarke, DOB [birth year], MRN [id number]  Patient Location: Home Provider Location: Office  PCP:  Patient, No Pcp Per  Cardiologist: Dr. 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  She was referred by [facility] emergency room for atypical chest pain.  She has no cardiac risk factors other than family history.  Her father had a myocardial infarction in his early 37s and died of a myocardial infarction at age 42.  She is never had a heart attack or stroke.  She is otherwise healthy and works out on the elliptical and hikes without symptoms or limitation.  She apparently had an upper respiratory tract flulike syndrome and was seen in an urgent care on 3/23 and was treated conservatively.  COVID-19 test was not performed.  She self quarantining for 3 weeks and her symptoms gradually improved.  She went back to work this past Monday and Tuesday without symptoms and on Wednesday she developed some sharp as well as pressure-like chest pain with shortness of breath.  She was  seen in the emergency room on 08/21/2018 by Dr. Wyvonnia Dusky , [place] doctor, who did the appropriate work-up including a chest x-ray that showed no active disease, and EKG that initially showed nonspecific ST and T wave changes which resolved after heart rate came down to the 80s from high 90s.  She had 2 sets of troponins which were negative as well.  She was sent home.  The case was reviewed by the covering cardiology fellow.  Since being at home she is had some intermittent sharp and dull chest pain as well as some shortness of breath.  The patient does not have symptoms concerning for COVID-19 infection (fever, chills, cough, or new shortness of breath).    No past medical history on file. Past Surgical History:  Procedure Laterality Date  . APPENDECTOMY    . CESAREAN SECTION     x3  . LAPAROSCOPIC TUBAL LIGATION Bilateral 10/02/2013   Procedure: LAPAROSCOPIC TUBAL LIGATION;  Surgeon: Lovenia Kim, MD;  Location: [place] [place] [facility];  Service: Gynecology;  Laterality: Bilateral;     Current Meds  Medication Sig  . naproxen (NAPROSYN) 500 MG tablet Take 1 tablet (500 mg total) by mouth 2 (two) times daily as needed.     Allergies:   Patient has no known allergies.   Social History   Tobacco Use  . Smoking status: Current Every Day Smoker    Packs/day: 0.25    Types: Cigarettes  . Smokeless tobacco: Never Used  Substance Use Topics  . Alcohol use: No  .  Drug use: No     Family Hx: The patient's family history is not on file.  ROS:   Please see the history of present illness.     All other systems reviewed and are negative.   Prior CV studies:   The following studies were reviewed today:    Labs/Other Tests and Data Reviewed:    EKG:  An ECG dated 08/20/2018 was personally reviewed today and demonstrated:  Normal sinus rhythm with nonspecific ST and T wave changes followed by normal sinus rhythm without ST or T wave changes.  Recent Labs: 08/20/2018: BUN 9; Creatinine, Ser 0.68;  Hemoglobin 13.3; Platelets 246; Potassium 3.1; Sodium 140 08/21/2018: ALT 16   Recent Lipid Panel No results found for: CHOL, TRIG, HDL, CHOLHDL, LDLCALC, LDLDIRECT  Wt Readings from Last 3 Encounters:  10/02/13 219 lb (99.3 kg)     Objective:    Vital Signs:  BP 109/68 Comment: wednesday 08/20/2018  Pulse 63   Ht 5' 7"  (1.702 m)   LMP 08/04/2018   BMI 34.30 kg/m    VITAL SIGNS:  reviewed  ASSESSMENT & PLAN:    1. Atypical chest pain- Ms. Fleeman is seen virtually today because of atypical chest pain.  She had a URI type illness on 3/23 but was never tested for COVID-19.  She self quarantining for 3 weeks and her symptoms gradually resolved.  She was seen in the emergency room 2 days ago with sharp and dull chest pain and shortness of breath.  Her work-up was unrevealing including normal chest x-ray, nonspecific changes on her EKG and 2 sets of negative troponins.  Her only risk factor is family history.  She is otherwise quite active.  I am not convinced this is cardiac in nature although given her family history I think we need to be concerned enough to do diagnostic test to rule this out.  I am going get a 2D echo and a coronary CTA to further evaluate early next week.  COVID-19 Education: The signs and symptoms of COVID-19 were discussed with the patient and how to seek care for testing (follow up with PCP or arrange E-visit).  The importance of social distancing was discussed today.  Time:   Today, I have spent 16 minutes with the patient with telehealth technology discussing the above problems.     Medication Adjustments/Labs and Tests Ordered: Current medicines are reviewed at length with the patient today.  Concerns regarding medicines are outlined above.   Tests Ordered: No orders of the defined types were placed in this encounter.   Medication Changes: No orders of the defined types were placed in this encounter.   Disposition:  Follow up in 4 week(s)  Signed,  Quay Burow, MD  08/22/2018 3:49 PM    [facility]

## 2018-08-22 NOTE — Telephone Encounter (Signed)
Virtual Visit Pre-Appointment Phone Call  Steps For Call:  1. Confirm consent - "In the setting of the current Covid19 crisis, you are scheduled for a (phone or video) visit with your provider on (date) at (time).  Just as we do with many in-office visits, in order for you to participate in this visit, we must obtain consent.  If you'd like, I can send this to your mychart (if signed up) or email for you to review.  Otherwise, I can obtain your verbal consent now.  All virtual visits are billed to your insurance company just like a normal visit would be.  By agreeing to a virtual visit, we'd like you to understand that the technology does not allow for your provider to perform an examination, and thus may limit your provider's ability to fully assess your condition. If your provider identifies any concerns that need to be evaluated in person, we will make arrangements to do so.  Finally, though the technology is pretty good, we cannot assure that it will always work on either your or our end, and in the setting of a video visit, we may have to convert it to a phone-only visit.  In either situation, we cannot ensure that we have a secure connection.  Are you willing to proceed?" STAFF: Did the patient verbally acknowledge consent to telehealth visit? Document YES/NO here: yes  2. Confirm the BEST phone number to call the day of the visit by including in appointment notes  3. Give patient instructions for WebEx/MyChart download to smartphone as below or Doximity/Doxy.me if video visit (depending on what platform provider is using)  4. Advise patient to be prepared with their blood pressure, heart rate, weight, any heart rhythm information, their current medicines, and a piece of paper and pen handy for any instructions they may receive the day of their visit  5. Inform patient they will receive a phone call 15 minutes prior to their appointment time (may be from unknown caller ID) so they should be  prepared to answer  6. Confirm that appointment type is correct in Epic appointment notes (VIDEO vs PHONE)     TELEPHONE CALL NOTE  Kathy Clarke has been deemed a candidate for a follow-up tele-health visit to limit community exposure during the Covid-19 pandemic. I spoke with the patient via phone to ensure availability of phone/video source, confirm preferred email & phone number, and discuss instructions and expectations.  I reminded Kathy Clarke to be prepared with any vital sign and/or heart rhythm information that could potentially be obtained via home monitoring, at the time of her visit. I reminded Kathy Clarke to expect a phone call at the time of her visit if her visit.  Maddock Finigan Dawna Part, RN 08/22/2018 3:19 PM   INSTRUCTIONS FOR DOWNLOADING THE [place] APP TO SMARTPHONE  - If Apple, ask patient to go to [employer] and type in WebEx in the search bar. [place] [employer], the blue/green circle. If Android, go to [employer] and type in [employer] in the search bar. The app is free but as with any other app downloads, their phone may require them to verify saved payment information or Apple/Android password.  - The patient does NOT have to create an account. - On the day of the visit, the assist will walk the patient through joining the meeting with the meeting number/password.  INSTRUCTIONS FOR DOWNLOADING THE MYCHART APP TO SMARTPHONE  - The patient must first make sure to  have activated MyChart and know their login information - If Apple, go to [employer] and type in MyChart in the search bar and download the app. If Android, ask patient to go to [employer] and type in [place] in the search bar and download the app. The app is free but as with any other app downloads, their phone may require them to verify saved payment information or Apple/Android password.  - The patient will need to then log into the app with their MyChart username and password, and select   as their healthcare provider to link the account. When it is time for your visit, go to the MyChart app, find appointments, and click Begin Video Visit. Be sure to Select Allow for your device to access the Microphone and Camera for your visit. You will then be connected, and your provider will be with you shortly.  **If they have any issues connecting, or need assistance please contact MyChart service desk [phone])**  **If using a computer, in order to ensure the best quality for their visit they will need to use either of the following Internet Browsers: [employer], or Google Chrome**  IF USING DOXIMITY or DOXY.ME - The patient will receive a link just prior to their visit, either by text or email (to be determined day of appointment depending on if it's doxy.me or Doximity).     FULL LENGTH CONSENT FOR TELE-HEALTH VISIT   I hereby voluntarily request, consent and authorize [facility] and its employed or contracted physicians, physician assistants, nurse practitioners or other licensed health care professionals (the Practitioner), to provide me with telemedicine health care services (the "Services") as deemed necessary by the treating Practitioner. I acknowledge and consent to receive the Services by the Practitioner via telemedicine. I understand that the telemedicine visit will involve communicating with the Practitioner through live audiovisual communication technology and the disclosure of certain medical information by electronic transmission. I acknowledge that I have been given the opportunity to request an in-person assessment or other available alternative prior to the telemedicine visit and am voluntarily participating in the telemedicine visit.  I understand that I have the right to withhold or withdraw my consent to the use of telemedicine in the course of my care at any time, without affecting my right to future care or treatment, and that the  Practitioner or I may terminate the telemedicine visit at any time. I understand that I have the right to inspect all information obtained and/or recorded in the course of the telemedicine visit and may receive copies of available information for a reasonable fee.  I understand that some of the potential risks of receiving the Services via telemedicine include:  Marland Kitchen Delay or interruption in medical evaluation due to technological equipment failure or disruption; . Information transmitted may not be sufficient (e.g. poor resolution of images) to allow for appropriate medical decision making by the Practitioner; and/or  . In rare instances, security protocols could fail, causing a breach of personal health information.  Furthermore, I acknowledge that it is my responsibility to provide information about my medical history, conditions and care that is complete and accurate to the best of my ability. I acknowledge that Practitioner's advice, recommendations, and/or decision may be based on factors not within their control, such as incomplete or inaccurate data provided by me or distortions of diagnostic images or specimens that may result from electronic transmissions. I understand that the practice of medicine is not an exact science and  that Practitioner makes no warranties or guarantees regarding treatment outcomes. I acknowledge that I will receive a copy of this consent concurrently upon execution via email to the email address I last provided but may also request a printed copy by calling the office of [facility].    I understand that my insurance will be billed for this visit.   I have read or had this consent read to me. . I understand the contents of this consent, which adequately explains the benefits and risks of the Services being provided via telemedicine.  . I have been provided ample opportunity to ask questions regarding this consent and the Services and have had my questions answered to my  satisfaction. . I give my informed consent for the services to be provided through the use of telemedicine in my medical care  By participating in this telemedicine visit I agree to the above.

## 2018-08-26 ENCOUNTER — Other Ambulatory Visit: Payer: Self-pay

## 2018-08-26 ENCOUNTER — Ambulatory Visit (HOSPITAL_COMMUNITY): Payer: Medicaid Other | Attending: Cardiovascular Disease

## 2018-08-26 DIAGNOSIS — R079 Chest pain, unspecified: Secondary | ICD-10-CM | POA: Diagnosis present

## 2018-08-27 ENCOUNTER — Telehealth (HOSPITAL_COMMUNITY): Payer: Self-pay | Admitting: Emergency Medicine

## 2018-08-27 NOTE — Telephone Encounter (Signed)
Left message on voicemail with name and callback number Marchia Bond RN Navigator Cardiac Imaging [facility] Heart [facility] V[facility] Services [phone] Office [phone] Cell  Sending message on mychart

## 2018-08-28 ENCOUNTER — Ambulatory Visit (HOSPITAL_COMMUNITY)
Admission: RE | Admit: 2018-08-28 | Discharge: 2018-08-28 | Disposition: A | Discharge status: Home / Self Care | Payer: Medicaid Other | Source: Ambulatory Visit | Attending: Cardiovascular Disease | Admitting: Cardiovascular Disease

## 2018-08-28 ENCOUNTER — Ambulatory Visit (HOSPITAL_COMMUNITY): Admission: RE | Admit: 2018-08-28 | Payer: Medicaid Other | Source: Ambulatory Visit

## 2018-08-28 ENCOUNTER — Other Ambulatory Visit: Payer: Self-pay

## 2018-08-28 DIAGNOSIS — R079 Chest pain, unspecified: Secondary | ICD-10-CM | POA: Insufficient documentation

## 2018-08-28 MED ORDER — IOHEXOL 350 MG/ML SOLN
80.0000 mL | Freq: Once | INTRAVENOUS | Status: AC | PRN
  Administered 2018-08-28: 80 mL via INTRAVENOUS

## 2018-08-28 MED ORDER — NITROGLYCERIN 0.4 MG SL SUBL
SUBLINGUAL_TABLET | SUBLINGUAL | Status: AC
  Filled 2018-08-28: qty 2

## 2018-08-28 MED ORDER — NITROGLYCERIN 0.4 MG SL SUBL
0.8000 mg | SUBLINGUAL_TABLET | Freq: Once | SUBLINGUAL | Status: AC
  Administered 2018-08-28: 0.8 mg via SUBLINGUAL
  Filled 2018-08-28: qty 25

## 2018-08-29 ENCOUNTER — Telehealth: Payer: Self-pay | Admitting: Cardiovascular Disease

## 2018-08-29 NOTE — Telephone Encounter (Signed)
With normal tests, okay to go back to work from a cardiac point of view.

## 2018-08-29 NOTE — Telephone Encounter (Signed)
Spoke with pt, aware of echo and CTA results. She has a follow up next week. She is still home from work and the sharpe pain is not as often, she has not had any in 2 days. She continues with tightness and SOB, it is better just not gone. She is wondering if okay to return to work. She works on a Architect job site. Does she continue to rest until symptoms are completely gone or is she good to return to work?  Will forward to dr berry to review and advise.

## 2018-08-29 NOTE — Telephone Encounter (Signed)
Pt aware of the following Per Dr. Gwenlyn Found:  "With normal tests, okay to go back to work from a cardiac point of view." Pt verbalized understanding

## 2018-08-29 NOTE — Telephone Encounter (Signed)
Follow Up:    Pt would like to know if her Echo results are back from Tuesday and Ct results results from yesterday. Pt wanted you to know she is still having some chest pressure and shortness of breath.

## 2018-09-02 ENCOUNTER — Telehealth: Payer: Self-pay | Admitting: Cardiovascular Disease

## 2018-09-03 ENCOUNTER — Telehealth (INDEPENDENT_AMBULATORY_CARE_PROVIDER_SITE_OTHER): Payer: Medicaid Other | Admitting: Cardiovascular Disease

## 2018-09-03 ENCOUNTER — Other Ambulatory Visit: Payer: Self-pay

## 2018-09-03 ENCOUNTER — Telehealth: Payer: Self-pay

## 2018-09-03 VITALS — Ht 67.0 in | Wt 172.0 lb

## 2018-09-03 DIAGNOSIS — R0789 Other chest pain: Secondary | ICD-10-CM

## 2018-09-03 DIAGNOSIS — Z13220 Encounter for screening for lipoid disorders: Secondary | ICD-10-CM

## 2018-09-03 NOTE — Addendum Note (Signed)
Addended by: Annita Brod on: 09/03/2018 11:51 AM   Modules accepted: Orders

## 2018-09-03 NOTE — Progress Notes (Signed)
Virtual Visit via Video Note   This visit type was conducted due to national recommendations for restrictions regarding the COVID-19 Pandemic (e.g. social distancing) in an effort to limit this patient's exposure and mitigate transmission in our community.  Due to her co-morbid illnesses, this patient is at least at moderate risk for complications without adequate follow up.  This format is felt to be most appropriate for this patient at this time.  All issues noted in this document were discussed and addressed.  A limited physical exam was performed with this format.  Please refer to the patient's chart for her consent to telehealth for [facility].   Evaluation Performed:  Follow-up visit  Date:  09/03/2018   ID:  Kathy Clarke, DOB 10/06/1970, MRN [id number]  Patient Location: Other:  Her workplace Provider Location: Home  PCP:  Patient, No Pcp Per  Cardiologist: Dr. 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  She was referred by [facility] emergency room for atypical chest pain.  I last saw her in the office virtually 08/22/2018. She has no cardiac risk factors other than family history.  Her father had a myocardial infarction in his early 73s and died of a myocardial infarction at age 25.  She is never had a heart attack or stroke.  She is otherwise healthy and works out on the elliptical and hikes without symptoms or limitation.  She apparently had an upper respiratory tract flulike syndrome and was seen in an urgent care on 3/23 and was treated conservatively.  COVID-19 test was not performed.  She self quarantining for 3 weeks and her symptoms gradually improved.  She went back to work this past Monday and Tuesday without symptoms and on Wednesday  she developed some sharp as well as pressure-like chest pain with shortness of breath.  She was seen in the emergency room on 08/21/2018 by Dr. Wyvonnia Dusky , [place] doctor, who did the appropriate work-up including a chest x-ray that showed no active disease, and EKG that initially showed nonspecific ST and T wave changes which resolved after heart rate came down to the 80s from high 90s.  She had 2 sets of troponins which were negative as well.  She was sent home.  The case was reviewed by the covering cardiology fellow.  Since being at home she is had some intermittent sharp and dull chest pain as well as some shortness of breath although over the last several days she has become asymptomatic.  I performed 2D echocardiography 08/26/2026 which was entirely normal and a coronary CTA 2 days later that showed a coronary calcium score of 0 and no CAD.  I have reassured the patient.  I suspect this was all secondary to her upper respiratory tract infection although she never tested positive for COVID-19. The patient does not have symptoms concerning for COVID-19 infection (fever, chills, cough, or new shortness of breath).    No past medical history on file. Past Surgical History:  Procedure Laterality Date  . APPENDECTOMY    . CESAREAN SECTION     x3  . LAPAROSCOPIC TUBAL LIGATION Bilateral 10/02/2013   Procedure: LAPAROSCOPIC TUBAL LIGATION;  Surgeon: Lovenia Kim, MD;  Location: [facility];  Service: Gynecology;  Laterality: Bilateral;     Current  Meds  Medication Sig  . [DISCONTINUED] aspirin 81 MG tablet Take 1 tablet (81 mg total) by mouth daily.  . [DISCONTINUED] naproxen (NAPROSYN) 500 MG tablet Take 1 tablet (500 mg total) by mouth 2 (two) times daily as needed.     Allergies:   Patient has no known allergies.   Social History   Tobacco Use  . Smoking status: Current Every Day Smoker    Packs/day: 0.25    Types: Cigarettes  . Smokeless tobacco: Never Used  Substance Use Topics  . Alcohol  use: No  . Drug use: No     Family Hx: The patient's family history is not on file.  ROS:   Please see the history of present illness.     All other systems reviewed and are negative.   Prior CV studies:   The following studies were reviewed today:  2D echocardiogram, coronary CTA  Labs/Other Tests and Data Reviewed:    EKG:  No ECG reviewed.  Recent Labs: 08/20/2018: BUN 9; Creatinine, Ser 0.68; Hemoglobin 13.3; Platelets 246; Potassium 3.1; Sodium 140 08/21/2018: ALT 16   Recent Lipid Panel No results found for: CHOL, TRIG, HDL, CHOLHDL, LDLCALC, LDLDIRECT  Wt Readings from Last 3 Encounters:  09/03/18 172 lb (78 kg)  10/02/13 219 lb (99.3 kg)     Objective:    Vital Signs:  Ht 5' 7"  (1.702 m)   Wt 172 lb (78 kg)   LMP 08/04/2018   BMI 26.94 kg/m    VITAL SIGNS:  reviewed GEN:  no acute distress RESPIRATORY:  normal respiratory effort, symmetric expansion NEURO:  alert and oriented x 3, no obvious focal deficit PSYCH:  normal affect  ASSESSMENT & PLAN:    1. Atypical chest pain- symptoms have resolved.  2D echo was normal as was coronary CTA with coronary calcium score of 0.  I suspect this was all related to her upper respiratory tract infection.  No further work-up is required.  COVID-19 Education: The signs and symptoms of COVID-19 were discussed with the patient and how to seek care for testing (follow up with PCP or arrange E-visit).  The importance of social distancing was discussed today.  Time:   Today, I have spent 8 minutes with the patient with telehealth technology discussing the above problems.     Medication Adjustments/Labs and Tests Ordered: Current medicines are reviewed at length with the patient today.  Concerns regarding medicines are outlined above.   Tests Ordered: Fasting lipid and liver profile  Medication Changes: No orders of the defined types were placed in this encounter.   Disposition:  Follow up prn  Signed,  Quay Burow, MD  09/03/2018 11:41 AM    [place]

## 2018-09-03 NOTE — Telephone Encounter (Signed)
lmtcb to review avs instructions

## 2018-09-03 NOTE — Patient Instructions (Addendum)
Medication Instructions:  Your physician recommends that you continue on your current medications as directed. Please refer to the Current Medication list given to you today.  If you need a refill on your cardiac medications before your next appointment, please call your pharmacy.   Lab work: Your physician recommends that you return for lab work in: [place] AND LIVER FUNCTION TEST. YOU WILL RECEIVE A LAB SLIP IN THE MAIL. PLEASE DO NOT EAT OR DRINK (EXCEPT WATER) ANYTHING AFTER MIDNIGHT ON THE DAY YOU CHOOSE TO PRESENT FOR LAB WORK. YOU MAY EAT AFTER YOUR BLOOD HAS BEEN COLLECTED. NO APPOINTMENT IS NEEDED. If you have labs (blood work) drawn today and your tests are completely normal, you will receive your results only by: Marland Kitchen MyChart Message (if you have MyChart) OR . A paper copy in the mail If you have any lab test that is abnormal or we need to change your treatment, we will call you to review the results.  Testing/Procedures: NONE  Follow-Up: At [facility], you and your health needs are our priority.  As part of our continuing mission to provide you with exceptional heart care, we have created designated Provider Care Teams.  These Care Teams include your primary Cardiologist (physician) and Advanced Practice Providers (APPs -  Physician Assistants and Nurse Practitioners) who all work together to provide you with the care you need, when you need it. . You may schedule a follow up appointment AS NEEDED. You may see Dr. Gwenlyn Found or one of the following Advanced Practice Providers on your designated Care Team:   . Kerin Ransom, [place] . Almyra Deforest, PA-C . Fabian Sharp, PA-C . Jory Sims, DNP . Rosaria Ferries, PA-C . Roby Lofts, PA-C . Sande Rives, PA-C

## 2018-11-03 NOTE — Telephone Encounter (Signed)
Opened in error

## 2019-02-03 ENCOUNTER — Encounter (HOSPITAL_COMMUNITY): Payer: Self-pay | Admitting: Emergency Medicine

## 2019-02-03 ENCOUNTER — Emergency Department (HOSPITAL_COMMUNITY): Payer: Medicaid Other

## 2019-02-03 ENCOUNTER — Emergency Department (HOSPITAL_COMMUNITY)
Admission: EM | Admit: 2019-02-03 | Discharge: 2019-02-03 | Disposition: A | Discharge status: Home / Self Care | Payer: Medicaid Other | Attending: Emergency Medicine | Admitting: Emergency Medicine

## 2019-02-03 DIAGNOSIS — Y92410 Unspecified street and highway as the place of occurrence of the external cause: Secondary | ICD-10-CM | POA: Insufficient documentation

## 2019-02-03 DIAGNOSIS — Y93I9 Activity, other involving external motion: Secondary | ICD-10-CM | POA: Insufficient documentation

## 2019-02-03 DIAGNOSIS — Y999 Unspecified external cause status: Secondary | ICD-10-CM | POA: Diagnosis not present

## 2019-02-03 DIAGNOSIS — M25552 Pain in left hip: Secondary | ICD-10-CM | POA: Insufficient documentation

## 2019-02-03 DIAGNOSIS — R0789 Other chest pain: Secondary | ICD-10-CM | POA: Insufficient documentation

## 2019-02-03 DIAGNOSIS — M25551 Pain in right hip: Secondary | ICD-10-CM | POA: Diagnosis not present

## 2019-02-03 DIAGNOSIS — M542 Cervicalgia: Secondary | ICD-10-CM | POA: Insufficient documentation

## 2019-02-03 DIAGNOSIS — M7918 Myalgia, other site: Secondary | ICD-10-CM

## 2019-02-03 MED ORDER — LIDOCAINE 5 % EX PTCH
1.0000 | MEDICATED_PATCH | CUTANEOUS | Status: DC
  Administered 2019-02-03: 15:00:00 1 via TRANSDERMAL
  Filled 2019-02-03: qty 1
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
## 2019-02-03 NOTE — ED Notes (Signed)
Patient transported to X-ray 

## 2019-02-03 NOTE — ED Notes (Signed)
Pt ambulatory without difficulty at discharge

## 2019-02-03 NOTE — Discharge Instructions (Signed)
Recommend Tylenol, Motrin as needed for pain control, recommend warm and cool compresses for pain control.  Recommend follow-up with primary doctor later this week for recheck.  Develop worsening neck pain, numbness, weakness, or other new concerning symptom please return here for recheck.
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
## 2019-04-07 ENCOUNTER — Other Ambulatory Visit: Payer: Self-pay

## 2019-04-07 ENCOUNTER — Ambulatory Visit (HOSPITAL_COMMUNITY)
Admission: EM | Admit: 2019-04-07 | Discharge: 2019-04-07 | Disposition: A | Discharge status: Home / Self Care | Payer: Medicaid Other
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
## 2020-05-09 IMAGING — CR DG CHEST 2V
2 series · 2 of 2 positions shown · non-contrast
Comparison: 08/20/2018

CLINICAL DATA: Pain

EXAM:
CHEST - 2 VIEW

[w chest pa]
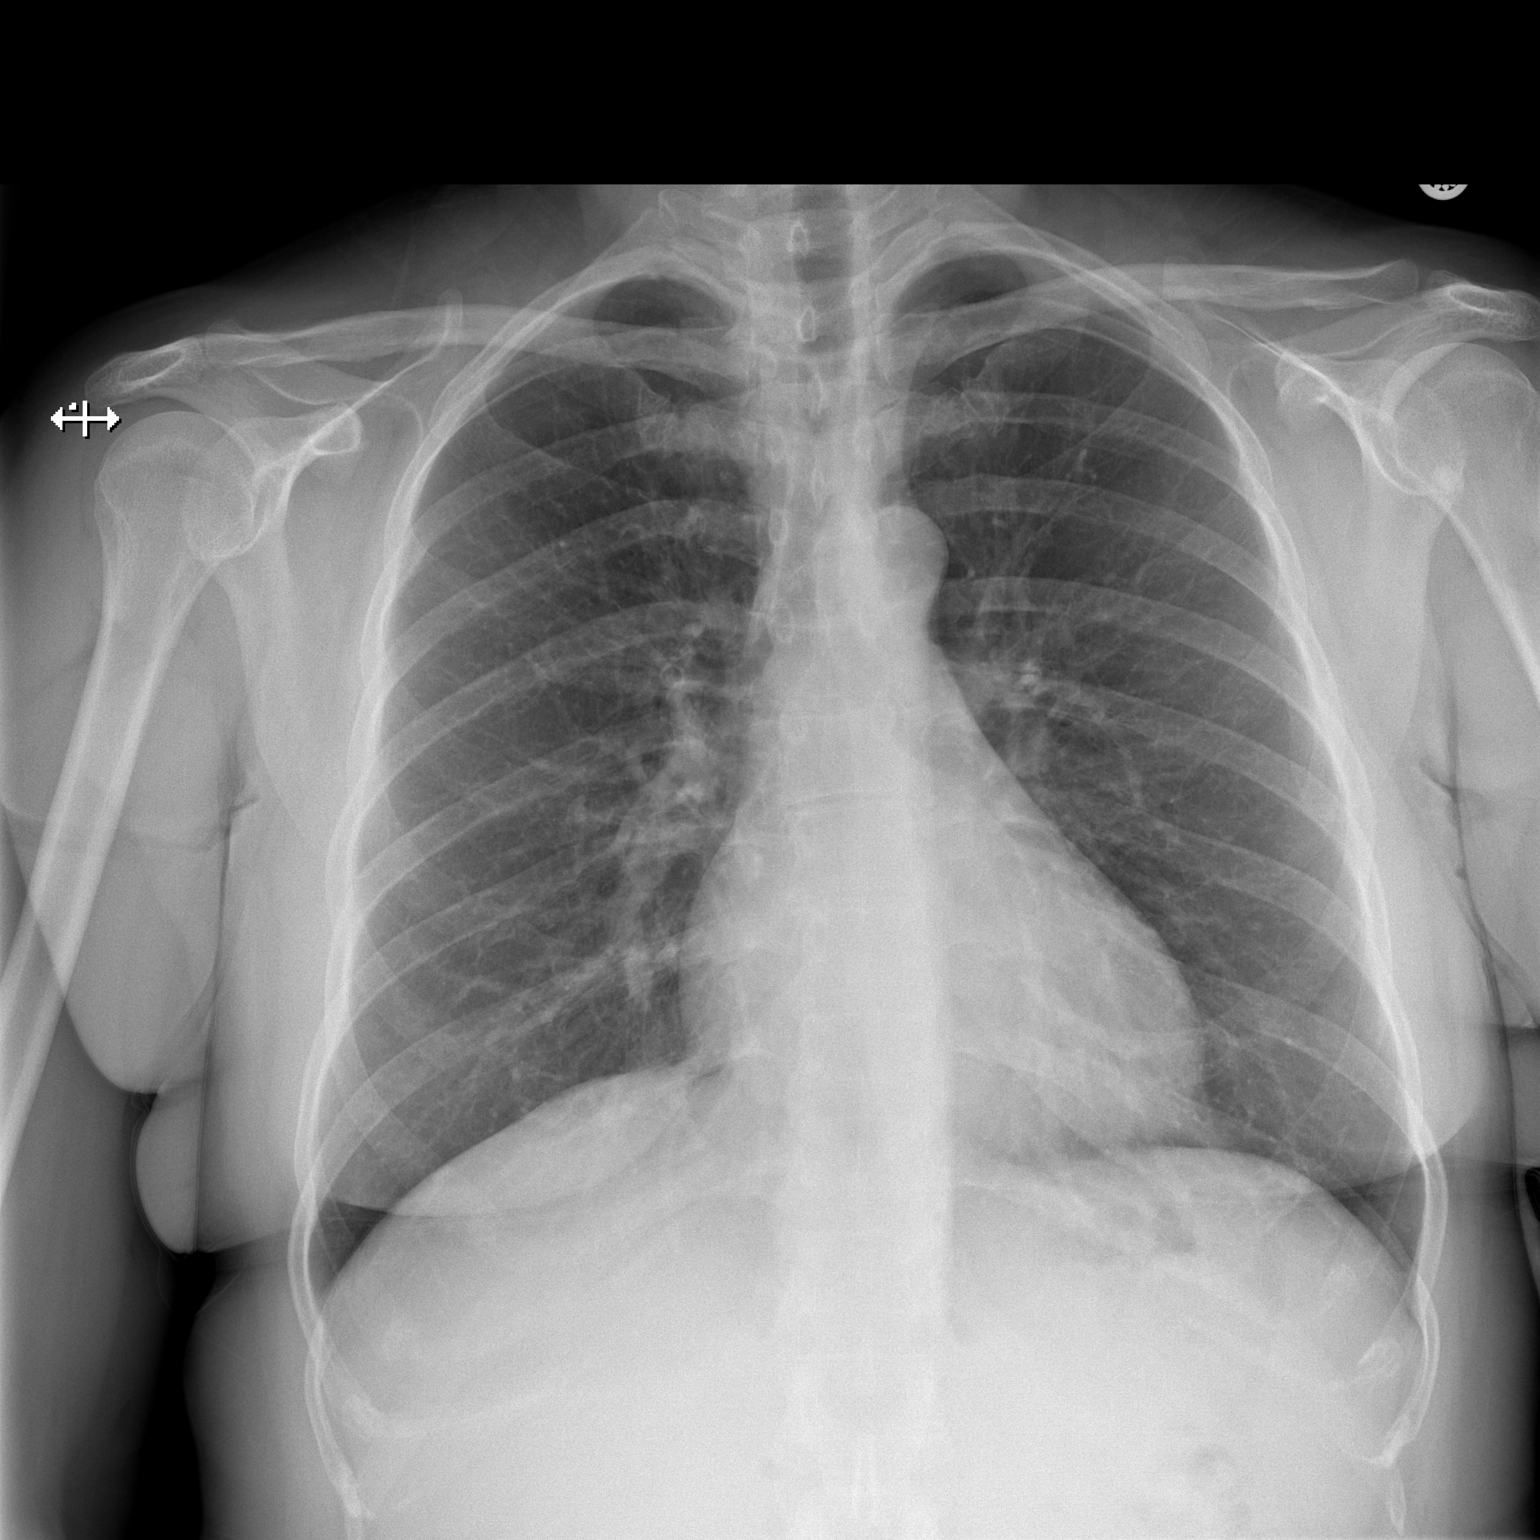

[w chest lat]
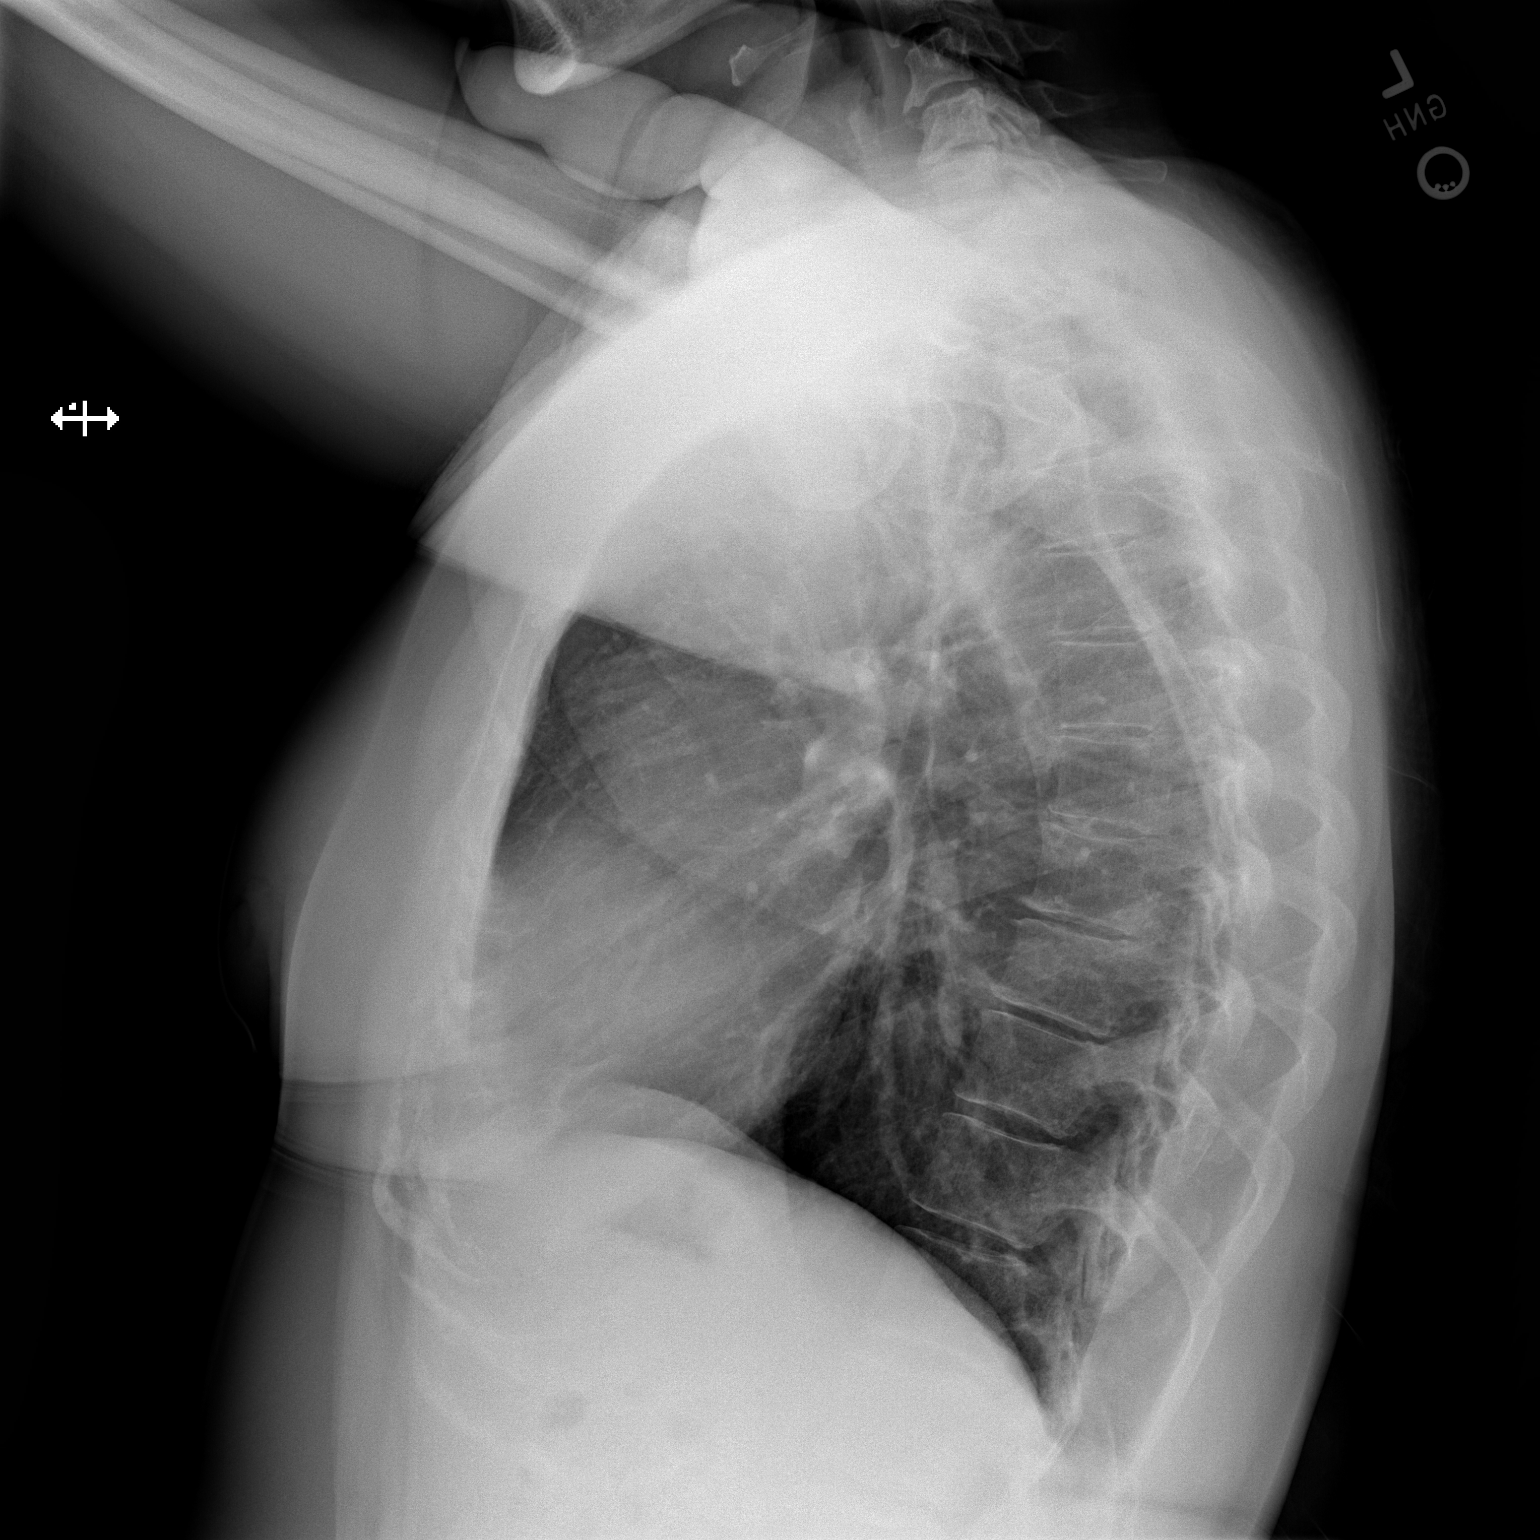

[2 of 2 positions shown; findings below may reference images not displayed]

FINDINGS: The heart size and mediastinal contours are within normal limits.
Both lungs are clear. The visualized skeletal structures are
unremarkable.
IMPRESSION: No acute abnormality of the lungs.
